# Patient Record
Sex: Male | Born: 1990 | Race: White | Hispanic: No | Marital: Single | Smoking: Never smoker
Health system: Southern US, Community
[De-identification: ages and names within clinical notes are randomized; demographics above are authoritative.]

---
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
## 2016-05-06 ENCOUNTER — Encounter: Payer: Self-pay | Admitting: Emergency Medicine

## 2016-05-06 ENCOUNTER — Emergency Department
Admission: EM | Admit: 2016-05-06 | Discharge: 2016-05-06 | Disposition: A | Discharge status: Home / Self Care | Payer: Self-pay | Attending: Emergency Medicine | Admitting: Emergency Medicine

## 2016-05-06 DIAGNOSIS — K029 Dental caries, unspecified: Secondary | ICD-10-CM | POA: Insufficient documentation

## 2016-05-06 DIAGNOSIS — K0889 Other specified disorders of teeth and supporting structures: Secondary | ICD-10-CM | POA: Insufficient documentation

## 2016-05-06 MED ORDER — AMOXICILLIN 500 MG PO TABS: 500.0000 mg | ORAL_TABLET | Freq: Three times a day (TID) | ORAL | 0 refills | Status: AC

## 2016-05-06 MED ORDER — NAPROXEN 500 MG PO TABS: 500.0000 mg | ORAL_TABLET | Freq: Two times a day (BID) | ORAL | 0 refills | Status: AC

## 2016-05-06 MED ORDER — TRAMADOL HCL 50 MG PO TABS: 50.0000 mg | ORAL_TABLET | Freq: Four times a day (QID) | ORAL | 0 refills | Status: DC | PRN

## 2016-05-06 NOTE — ED Triage Notes (Signed)
Pt states has been having pain from his wisdom teeth, states he has not been able to see a dentist due to lack of insurance. Pt states pain started in R lower jaw, and has progressed to be on R upper jaw. No obvious swelling noted at this time.

## 2016-05-06 NOTE — ED Notes (Signed)
NAD noted at time of D/C. Pt denies questions or concerns. Pt ambulatory to the lobby at this time.  

## 2016-05-06 NOTE — Discharge Instructions (Signed)
OPTIONS FOR DENTAL FOLLOW UP CARE ° °[place] Department of Health and Human Services - Local Safety Net Dental Clinics °http://www.ncdhhs.gov/dph/oralhealth/services/safetynetclinics.htm °  °[facility] ([phone]) ° °[facility] ([phone]) ° °[facility] ([phone] ext [phone]) ° °[facility] ([phone]) ° °[facility] ([phone]) °This clinic caters to the indigent population and is on a lottery system. °Location: °[facility], [address], [place] °Clinic Hours: °Wednesdays from 6pm - 9pm, patients seen by a lottery system. °For dates, call or go to [email or web address] °Services: °Cleanings, fillings and simple extractions. °Payment Options: °DENTAL WORK IS FREE OF CHARGE. Bring proof of income or support. °Best way to get seen: °Arrive at 5:15 pm - this is a lottery, NOT first come/first serve, so arriving earlier will not increase your chances of being seen. °  °  °[facility] °[phone] °Select option 1 for emergencies °  °Location: °[facility], [address], [place] °Clinic Hours: °No walk-ins accepted - call the day before to schedule an appointment. °Check in times are 9:30 am and 1:30 pm. °Services: °Simple extractions, temporary fillings, pulpectomy/pulp debridement, uncomplicated abscess drainage. °Payment Options: °PAYMENT IS DUE AT THE TIME OF SERVICE.  Fee is usually $100-200, additional surgical procedures (e.g. abscess drainage) may be extra. °Cash, checks, Visa/MasterCard accepted.  Can file Medicaid if patient is covered for dental - patient should call case worker to check. °No discount for [facility] patients. °Best way to get seen: °MUST call the day before and get onto the schedule. Can usually be seen the next 1-2 days. No walk-ins accepted. °  °  °[facility] °[phone] °   °Location: °[facility], [address], [place] °Clinic Hours: °M, W, Th, F 8am or 1:30pm, Tues 9a or 1:30 - first come/first served. °Services: °Simple extractions, temporary fillings, uncomplicated abscess drainage.  You do not need to be an [place] resident. °Payment Options: °PAYMENT IS DUE AT THE TIME OF SERVICE. °Dental insurance, otherwise sliding scale - bring proof of income or support. °Depending on income and treatment needed, cost is usually $50-200. °Best way to get seen: °Arrive early as it is first come/first served. °  °  °[facility] °[phone] °  °Location: °[address] °Clinic Hours: °Mon-Thu 8a-5p °Services: °Most basic dental services including extractions and fillings. °Payment Options: °PAYMENT IS DUE AT THE TIME OF SERVICE. °Sliding scale, up to 50% off - bring proof if income or support. °Medicaid with dental option accepted. °Best way to get seen: °Call to schedule an appointment, can usually be seen within 2 weeks OR they will try to see walk-ins - show up at 8a or 2p (you may have to wait). °  °  °[facility] °[phone] °[place] RESIDENTS ONLY °  °Location: °[facility], [address], [place],  [address] °Clinic Hours: By appointment only. °Monday - Thursday 8am-5pm, Friday 8am-12pm °Services: Cleanings, fillings, extractions. °Payment Options: °PAYMENT IS DUE AT THE TIME OF SERVICE. °Cash, Visa or MasterCard. Sliding scale - $30 minimum per service. °Best way to get seen: °Come in to office, complete packet and make an appointment - need proof of income °or support monies for each household member and proof of [place] residence. °Usually takes about a month to get in. °  °  °[facility] °[phone] °  °Location: °[address]   [place] °Clinic Hours: Walk-in Urgent Care Dental Services are offered Monday-Friday  mornings only. °The numbers of emergencies accepted daily is limited to the number of °providers available. °Maximum 15 - Mondays, Wednesdays & Thursdays °Maximum 10 - Tuesdays & Fridays °Services: °You do not need to be a [place] resident to be seen for a dental emergency. °Emergencies are defined as pain, swelling, abnormal bleeding, or dental trauma. Walkins will receive x-rays if needed. °NOTE: Dental cleaning is not an emergency. °Payment Options: °PAYMENT IS DUE AT THE TIME OF SERVICE. °Minimum co-pay is $40.00 for uninsured patients. °Minimum co-pay is $3.00 for Medicaid with dental coverage. °Dental Insurance is accepted and must be presented at time of visit. °Medicare does not cover dental. °Forms of payment: Cash, credit card, checks. °Best way to get seen: °If not previously registered with the clinic, walk-in dental registration begins at 7:15 am and is on a first come/first serve basis. °If previously registered with the clinic, call to make an appointment. °  °  °[facility] °[phone] °[place] RESIDENTS ONLY °  °Location: °[address], [place], [place] °Clinic Hours: °Mon-Thu 10a-2p °Services: Extractions only! °Payment Options: °FREE (donations accepted) - bring proof of income or support °Best way to get seen: °Call and schedule an appointment OR come at 8am on the 1st Monday of every month (except for holidays) when it is first come/first served. °  °  °[facility] °[phone] °  °Location: °[address], [place] °Clinic Hours: °Friday mornings °Services, Payment Options, Best way to get seen: °Call for info ° °Please call and schedule a dental appointment as soon as possible. You will need to be seen within the next 14 days. Return to the emergency department for symptoms that change or worsen if you're unable to schedule an appointment. ° °

## 2016-05-06 NOTE — ED Provider Notes (Signed)
[facility] Emergency Department Provider Note ____________________________________________  Time seen: Approximately 2:52 PM  I have reviewed the triage vital signs and the nursing notes.   HISTORY  Chief Complaint Dental Pain   HPI Matthew Hampton is a 25 y.o. male who presents to the emergency department for evaluation of dental pain. He states that he's been having pain from his system teeth but he has been unable to see his dentist due to lack of insurance. Pain is in the right lower jaw with radiation into the right upper jaw. He states he has been taking ibuprofen, using ice packs to his face, and chewing on ice to help with the pain without any relief. He denies fever.  History reviewed. No pertinent past medical history.  There are no active problems to display for this patient.   History reviewed. No pertinent surgical history.  Prior to Admission medications   Medication Sig Start Date End Date Taking? Authorizing Provider  amoxicillin (AMOXIL) 500 MG tablet Take 1 tablet (500 mg total) by mouth 3 (three) times daily. 05/06/16   Chinita Pesterari B Naeem Quillin, FNP  naproxen (NAPROSYN) 500 MG tablet Take 1 tablet (500 mg total) by mouth 2 (two) times daily with a meal. 05/06/16   Dede Dobesh B Hailie Searight, FNP  traMADol (ULTRAM) 50 MG tablet Take 1 tablet (50 mg total) by mouth every 6 (six) hours as needed. 05/06/16   Chinita Pesterari B Leroy Pettway, FNP    Allergies Review of patient's allergies indicates no known allergies.  History reviewed. No pertinent family history.  Social History Social History  Substance Use Topics  . Smoking status: Never Smoker  . Smokeless tobacco: Never Used  . Alcohol use No    Review of Systems Constitutional: Well appearing ENT: No otalgia, rhinorrhea, or dysphasia. Musculoskeletal: Positive for jaw pain/trismus. Skin: Negative for erythema ____________________________________________   PHYSICAL EXAM:  VITAL SIGNS: ED Triage Vitals  Enc  Vitals Group     BP 05/06/16 1404 125/86     Pulse Rate 05/06/16 1404 93     Resp 05/06/16 1404 18     Temp 05/06/16 1404 98.3 F (36.8 C)     Temp Source 05/06/16 1404 Oral     SpO2 05/06/16 1404 100 %     Weight 05/06/16 1405 175 lb (79.4 kg)     Height 05/06/16 1405 6' (1.829 m)     Head Circumference --      Peak Flow --      Pain Score 05/06/16 1405 4     Pain Loc --      Pain Edu? --      Excl. in GC? --     Constitutional: Alert and oriented. Well appearing and in no acute distress. Eyes: Conjunctivae are normal.EOMI. Mouth/Throat: Mucous membranes are moist. Oropharynx non-erythematous. Periodontal Exam    Hematological/Lymphatic/Immunilogical: No cervical lymphadenopathy. Respiratory: Normal respiratory effort.  Musculoskeletal: Full ROM x 4 extremities. Neurologic:  Normal speech and language. No gross focal neurologic deficits are appreciated. Speech is normal. No gait instability. Skin:  No swelling or erythema of the right lower jaw noted Psychiatric: Mood and affect are normal. Speech and behavior are normal.  ____________________________________________   LABS (all labs ordered are listed, but only abnormal results are displayed)  Labs Reviewed - No data to display ____________________________________________   RADIOLOGY  Not indicated ____________________________________________   PROCEDURES  Procedure(s) performed: None  Critical Care performed: No  ____________________________________________   INITIAL IMPRESSION / ASSESSMENT AND PLAN / ED  COURSE  Pertinent labs & imaging results that were available during my care of the patient were reviewed by me and considered in my medical decision making (see chart for details). Patient will be prescribed amoxicillin, tramadol, Naprosyn. Patient was advised to see the dentist within 14 days. Also advised to take the antibiotic until finished. Instructed to return to the ER for symptoms that change or  worsen if unable to schedule an appointment. ____________________________________________   FINAL CLINICAL IMPRESSION(S) / ED DIAGNOSES  Final diagnoses:  Pain, dental    Note:  This document was prepared using Dragon voice recognition software and may include unintentional dictation errors.     Chinita PesterCari B Tyric Rodeheaver, FNP 05/06/16 1505    Phineas SemenGraydon Goodman, MD 05/06/16 [phone]

## 2019-05-03 ENCOUNTER — Emergency Department
Admission: EM | Admit: 2019-05-03 | Discharge: 2019-05-03 | Disposition: A | Discharge status: Home / Self Care | Payer: Self-pay | Attending: Emergency Medicine | Admitting: Emergency Medicine

## 2019-05-03 ENCOUNTER — Emergency Department: Payer: Self-pay

## 2019-05-03 ENCOUNTER — Other Ambulatory Visit: Payer: Self-pay

## 2019-05-03 ENCOUNTER — Encounter: Payer: Self-pay | Admitting: Intensive Care

## 2019-05-03 DIAGNOSIS — Z79899 Other long term (current) drug therapy: Secondary | ICD-10-CM | POA: Insufficient documentation

## 2019-05-03 DIAGNOSIS — K0381 Cracked tooth: Secondary | ICD-10-CM | POA: Insufficient documentation

## 2019-05-03 DIAGNOSIS — K047 Periapical abscess without sinus: Secondary | ICD-10-CM | POA: Insufficient documentation

## 2019-05-03 LAB — BASIC METABOLIC PANEL
Anion gap: 7 (ref 5–15)
BUN: 10 mg/dL (ref 6–20)
CO2: 23 mmol/L (ref 22–32)
Calcium: 9 mg/dL (ref 8.9–10.3)
Chloride: 106 mmol/L (ref 98–111)
Creatinine, Ser: 0.75 mg/dL (ref 0.61–1.24)
GFR calc Af Amer: >60 mL/min (ref >60)
GFR calc non Af Amer: >60 mL/min (ref >60)
Glucose, Bld: 94 mg/dL (ref 70–99)
Potassium: 4 mmol/L (ref 3.5–5.1)
Sodium: 136 mmol/L (ref 135–145)

## 2019-05-03 LAB — CBC WITH DIFFERENTIAL/PLATELET
Abs Immature Granulocytes: 0.06 10*3/uL (ref 0.00–0.07)
Basophils Absolute: 0.1 10*3/uL (ref 0.0–0.1)
Basophils Relative: 0 %
Eosinophils Absolute: 0.3 10*3/uL (ref 0.0–0.5)
Eosinophils Relative: 2 %
HCT: 38.6 % — ABNORMAL LOW (ref 39.0–52.0)
Hemoglobin: 13.2 g/dL (ref 13.0–17.0)
Immature Granulocytes: 0 %
Lymphocytes Relative: 16 %
Lymphs Abs: 2.2 10*3/uL (ref 0.7–4.0)
MCH: 31.4 pg (ref 26.0–34.0)
MCHC: 34.2 g/dL (ref 30.0–36.0)
MCV: 91.9 fL (ref 80.0–100.0)
Monocytes Absolute: 1.5 10*3/uL — ABNORMAL HIGH (ref 0.1–1.0)
Monocytes Relative: 11 %
Neutro Abs: 9.8 10*3/uL — ABNORMAL HIGH (ref 1.7–7.7)
Neutrophils Relative %: 71 %
Platelets: 312 10*3/uL (ref 150–400)
RBC: 4.2 MIL/uL — ABNORMAL LOW (ref 4.22–5.81)
RDW: 12 % (ref 11.5–15.5)
WBC: 14 10*3/uL — ABNORMAL HIGH (ref 4.0–10.5)
nRBC: 0 % (ref 0.0–0.2)

## 2019-05-03 MED ORDER — TRAMADOL HCL 50 MG PO TABS
50.0000 mg | ORAL_TABLET | Freq: Once | ORAL | Status: AC
  Administered 2019-05-03: 50 mg via ORAL
  Filled 2019-05-03: qty 1

## 2019-05-03 MED ORDER — IOHEXOL 300 MG/ML  SOLN
75.0000 mL | Freq: Once | INTRAMUSCULAR | Status: AC | PRN
  Administered 2019-05-03: 75 mL via INTRAVENOUS

## 2019-05-03 MED ORDER — TRAMADOL HCL 50 MG PO TABS: 50.0000 mg | ORAL_TABLET | Freq: Four times a day (QID) | ORAL | 0 refills | Status: AC | PRN

## 2019-05-03 MED ORDER — CLINDAMYCIN HCL 300 MG PO CAPS: 300.0000 mg | ORAL_CAPSULE | Freq: Three times a day (TID) | ORAL | 0 refills | Status: AC

## 2019-05-03 MED ORDER — CLINDAMYCIN PHOSPHATE 600 MG/50ML IV SOLN
600.0000 mg | Freq: Once | INTRAVENOUS | Status: AC
  Administered 2019-05-03: 600 mg via INTRAVENOUS
  Filled 2019-05-03: qty 50

## 2019-05-03 MED ORDER — KETOROLAC TROMETHAMINE 30 MG/ML IJ SOLN
30.0000 mg | Freq: Once | INTRAMUSCULAR | Status: AC
  Administered 2019-05-03: 30 mg via INTRAVENOUS
  Filled 2019-05-03: qty 1

## 2019-05-03 NOTE — Discharge Instructions (Signed)
OPTIONS FOR DENTAL FOLLOW UP CARE ° °[place] Department of Health and Human Services - Local Safety Net Dental Clinics °http://www.ncdhhs.gov/dph/oralhealth/services/safetynetclinics.htm °  °[facility] ([phone]) ° °[facility] ([phone]) ° °[facility] ([phone] ext [phone]) ° °[facility] ([phone]) ° °[facility] ([phone]) °This clinic caters to the indigent population and is on a lottery system. °Location: °[facility], [address], [place] °Clinic Hours: °Wednesdays from 6pm - 9pm, patients seen by a lottery system. °For dates, call or go to [email or web address] °Services: °Cleanings, fillings and simple extractions. °Payment Options: °DENTAL WORK IS FREE OF CHARGE. Bring proof of income or support. °Best way to get seen: °Arrive at 5:15 pm - this is a lottery, NOT first come/first serve, so arriving earlier will not increase your chances of being seen. °  °  °[facility] °[phone] °Select option 1 for emergencies °  °Location: °[facility], [address], [place] °Clinic Hours: °No walk-ins accepted - call the day before to schedule an appointment. °Check in times are 9:30 am and 1:30 pm. °Services: °Simple extractions, temporary fillings, pulpectomy/pulp debridement, uncomplicated abscess drainage. °Payment Options: °PAYMENT IS DUE AT THE TIME OF SERVICE.  Fee is usually $100-200, additional surgical procedures (e.g. abscess drainage) may be extra. °Cash, checks, Visa/MasterCard accepted.  Can file Medicaid if patient is covered for dental - patient should call case worker to check. °No discount for [facility] patients. °Best way to get seen: °MUST call the day before and get onto the schedule. Can usually be seen the next 1-2 days. No walk-ins accepted. °  °  °[facility] °[phone] °   °Location: °[facility], [address], [place] °Clinic Hours: °M, W, Th, F 8am or 1:30pm, Tues 9a or 1:30 - first come/first served. °Services: °Simple extractions, temporary fillings, uncomplicated abscess drainage.  You do not need to be an [place] resident. °Payment Options: °PAYMENT IS DUE AT THE TIME OF SERVICE. °Dental insurance, otherwise sliding scale - bring proof of income or support. °Depending on income and treatment needed, cost is usually $50-200. °Best way to get seen: °Arrive early as it is first come/first served. °  °  °[facility] °[phone] °  °Location: °[address] °Clinic Hours: °Mon-Thu 8a-5p °Services: °Most basic dental services including extractions and fillings. °Payment Options: °PAYMENT IS DUE AT THE TIME OF SERVICE. °Sliding scale, up to 50% off - bring proof if income or support. °Medicaid with dental option accepted. °Best way to get seen: °Call to schedule an appointment, can usually be seen within 2 weeks OR they will try to see walk-ins - show up at 8a or 2p (you may have to wait). °  °  °[facility] °[phone] °[place] RESIDENTS ONLY °  °Location: °[facility], [address], [place], [place] [address] °Clinic Hours: By appointment only. °Monday - Thursday 8am-5pm, Friday 8am-12pm °Services: Cleanings, fillings, extractions. °Payment Options: °PAYMENT IS DUE AT THE TIME OF SERVICE. °Cash, Visa or MasterCard. Sliding scale - $30 minimum per service. °Best way to get seen: °Come in to office, complete packet and make an appointment - need proof of income °or support monies for each household member and proof of [place] residence. °Usually takes about a month to get in. °  °  °[facility] °[phone] °  °Location: °[address]   [place] °Clinic Hours: Walk-in Urgent Care Dental Services are offered Monday-Friday  mornings only. °The numbers of emergencies accepted daily is limited to the number of °providers available. °Maximum 15 - Mondays, Wednesdays & Thursdays °Maximum 10 - Tuesdays & Fridays °Services: °You do not need to be a [place] resident to be seen for a dental emergency. °Emergencies are defined as pain, swelling, abnormal bleeding, or dental trauma. Walkins will receive x-rays if needed. °NOTE: Dental cleaning is not an emergency. °Payment Options: °PAYMENT IS DUE AT THE TIME OF SERVICE. °Minimum co-pay is $40.00 for uninsured patients. °Minimum co-pay is $3.00 for Medicaid with dental coverage. °Dental Insurance is accepted and must be presented at time of visit. °Medicare does not cover dental. °Forms of payment: Cash, credit card, checks. °Best way to get seen: °If not previously registered with the clinic, walk-in dental registration begins at 7:15 am and is on a first come/first serve basis. °If previously registered with the clinic, call to make an appointment. °  °  °[facility] °[phone] °[place] RESIDENTS ONLY °  °Location: °[address], [place], [place] °Clinic Hours: °Mon-Thu 10a-2p °Services: Extractions only! °Payment Options: °FREE (donations accepted) - bring proof of income or support °Best way to get seen: °Call and schedule an appointment OR come at 8am on the 1st Monday of every month (except for holidays) when it is first come/first served. °  °  °[facility] °[phone] °  °Location: °[address], [place] °Clinic Hours: °Friday mornings °Services, Payment Options, Best way to get seen: °Call for info °

## 2019-05-03 NOTE — ED Triage Notes (Signed)
Patient presents with left sided jaw swelling. Reports dental pain previously and when subsided the swelling started

## 2019-05-03 NOTE — ED Provider Notes (Signed)
[facility] Emergency Department Provider Note  ____________________________________________  Time seen: Approximately 8:57 PM  I have reviewed the triage vital signs and the nursing notes.   HISTORY  Chief Complaint Jaw Pain (left) and Facial Swelling    HPI Matthew Hampton is a 28 y.o. male presents to the emergency department with left lower jaw swelling from a broken inferior 19.  He denies pain underneath the tongue.  Patient denies fever and chills.  Patient states that he has taken amoxicillin for 1 day.  Patient states that he has experienced similar symptoms in the past which have improved with antibiotic.  He has been managing his own secretions at home.  No other alleviating measures have been attempted.        History reviewed. No pertinent past medical history.  There are no active problems to display for this patient.   History reviewed. No pertinent surgical history.  Prior to Admission medications   Medication Sig Start Date End Date Taking? Authorizing Provider  amoxicillin (AMOXIL) 500 MG tablet Take 1 tablet (500 mg total) by mouth 3 (three) times daily. 05/06/16   Triplett, Rulon Eisenmengerari B, FNP  clindamycin (CLEOCIN) 300 MG capsule Take 1 capsule (300 mg total) by mouth 3 (three) times daily for 10 days. 05/03/19 05/13/19  Orvil FeilWoods, Linsay Vogt M, PA-C  naproxen (NAPROSYN) 500 MG tablet Take 1 tablet (500 mg total) by mouth 2 (two) times daily with a meal. 05/06/16   Triplett, Cari B, FNP  traMADol (ULTRAM) 50 MG tablet Take 1 tablet (50 mg total) by mouth every 6 (six) hours as needed for up to 3 days. 05/03/19 05/06/19  Orvil FeilWoods, Maciah Feeback M, PA-C    Allergies Patient has no known allergies.  History reviewed. No pertinent family history.  Social History Social History   Tobacco Use  . Smoking status: Never Smoker  . Smokeless tobacco: Never Used  Substance Use Topics  . Alcohol use: No  . Drug use: No     Review of Systems  Constitutional: No  fever/chills Eyes: No visual changes. No discharge ENT: Patient has left-sided lower jaw pain and swelling. Cardiovascular: no chest pain. Respiratory: no cough. No SOB. Gastrointestinal: No abdominal pain.  No nausea, no vomiting.  No diarrhea.  No constipation. Genitourinary: Negative for dysuria. No hematuria Musculoskeletal: Negative for musculoskeletal pain. Skin: Negative for rash, abrasions, lacerations, ecchymosis. Neurological: Negative for headaches, focal weakness or numbness.   ____________________________________________   PHYSICAL EXAM:  VITAL SIGNS: ED Triage Vitals [05/03/19 1708]  Enc Vitals Group     BP 118/85     Pulse Rate (!) 119     Resp 16     Temp 98.4 F (36.9 C)     Temp Source Oral     SpO2 99 %     Weight 175 lb (79.4 kg)     Height 5\' 11"  (1.803 m)     Head Circumference      Peak Flow      Pain Score 7     Pain Loc      Pain Edu?      Excl. in GC?      Constitutional: Alert and oriented. Well appearing and in no acute distress. Eyes: Conjunctivae are normal. PERRL. EOMI. Head: Atraumatic. ENT:      Ears:       Nose: No congestion/rhinnorhea.      Mouth/Throat: Mucous membranes are moist.  Patient has edema of the left lower jaw and a broken  inferior 19.  No drainable abscess.  No appreciable fluctuance of the gingiva surrounding inferior 19.  No pain underneath the tongue. Cardiovascular: Normal rate, regular rhythm. Normal S1 and S2.  Good peripheral circulation. Respiratory: Normal respiratory effort without tachypnea or retractions. Lungs CTAB. Good air entry to the bases with no decreased or absent breath sounds.  Skin:  Skin is warm, dry and intact. No rash noted. Psychiatric: Mood and affect are normal. Speech and behavior are normal. Patient exhibits appropriate insight and judgement.   ____________________________________________   LABS (all labs ordered are listed, but only abnormal results are displayed)  Labs Reviewed   CBC WITH DIFFERENTIAL/PLATELET - Abnormal; Notable for the following components:      Result Value   WBC 14.0 (*)    RBC 4.20 (*)    HCT 38.6 (*)    Neutro Abs 9.8 (*)    Monocytes Absolute 1.5 (*)    All other components within normal limits  BASIC METABOLIC PANEL   ____________________________________________  EKG   ____________________________________________  RADIOLOGY I personally viewed and evaluated these images as part of my medical decision making, as well as reviewing the written report by the radiologist.**  Ct Soft Tissue Neck W Contrast  Result Date: 05/03/2019 CLINICAL DATA:  Initial evaluation for acute jaw pain. EXAM: CT NECK WITH CONTRAST TECHNIQUE: Multidetector CT imaging of the neck was performed using the standard protocol following the bolus administration of intravenous contrast. CONTRAST:  75mL OMNIPAQUE IOHEXOL 300 MG/ML  SOLN COMPARISON:  None available. FINDINGS: Pharynx and larynx: Oral cavity within normal limits. Prominent dental caries involving the left second and third mandibular molars noted. There is prominent soft tissue swelling with inflammatory stranding about the adjacent body of the left mandible, consistent with acute infection. Ill-defined hypodensity within this region measuring approximately 2.9 x 1.7 x 2.9 cm concerning for early odontogenic abscess (series 2, image 52). Adjacent swelling and inflammatory stranding within the left submandibular space, extending into the submental region and partially down the anterior aspect of the neck. No significant extension into the floor of mouth at this time. Oropharynx and nasopharynx within normal limits. No retropharyngeal collection. Epiglottis normal. Vallecula clear. Remainder of the hypopharynx and supraglottic larynx within normal limits. True cords symmetric and normal. Subglottic airway clear. Salivary glands: Parotid and right submandibular glands within normal limits. Inflammatory changes  surround the left submandibular gland related to the inflammatory process about the adjacent left mandible. Left submandibular gland itself within normal limits. Thyroid: Thyroid normal. Lymph nodes: Mildly prominent left level IB and II lymph nodes, likely reactive. No other pathologically enlarged lymph nodes seen within the neck. Vascular: Normal intravascular enhancement seen throughout the neck. Limited intracranial: Unremarkable. Visualized orbits: Unremarkable. Mastoids and visualized paranasal sinuses: Visualized paranasal sinuses are clear. Visualized mastoids and middle ear cavities are well pneumatized and free of fluid. Skeleton: Mild wedging deformity at the superior endplate of C5 is chronic in appearance. No acute osseous abnormality. No discrete lytic or blastic osseous lesions. Upper chest: Visualized upper chest demonstrates no acute finding. Partially visualized lungs are clear. Other: None. IMPRESSION: 1. Prominent dental caries involving the left second and third mandibular molars with associated soft tissue swelling and inflammatory stranding about the adjacent body of the left mandible, consistent with acute infection/cellulitis. Ill-defined hypodensity within this region measuring approximately 2.9 x 1.7 x 2.9 cm consistent with odontogenic abscess. 2. Mildly prominent left-sided cervical adenopathy, likely reactive. Electronically Signed   By: Janell QuietBenjamin  McClintock M.D.  On: 05/03/2019 18:48    ____________________________________________    PROCEDURES  Procedure(s) performed:    Procedures    Medications  clindamycin (CLEOCIN) IVPB 600 mg (600 mg Intravenous New Bag/Given 05/03/19 2054)  iohexol (OMNIPAQUE) 300 MG/ML solution 75 mL (75 mLs Intravenous Contrast Given 05/03/19 1833)  traMADol (ULTRAM) tablet 50 mg (50 mg Oral Given 05/03/19 2054)  ketorolac (TORADOL) 30 MG/ML injection 30 mg (30 mg Intravenous Given 05/03/19 2052)      ____________________________________________   INITIAL IMPRESSION / ASSESSMENT AND PLAN / ED COURSE  Pertinent labs & imaging results that were available during my care of the patient were reviewed by me and considered in my medical decision making (see chart for details).  Review of the [place] CSRS was performed in accordance of the [place] prior to dispensing any controlled drugs.         Assessment and plan Dental abscess 28 year old male presents to the emergency department with left lower jaw pain and swelling.  Patient has experienced symptoms for the past 3 to 4 days.  Patient was mildly tachycardic at triage but vital signs were otherwise reassuring.  Patient had edema of the left lower jaw with no appreciable drainable fluid collection in the ED.  CT of the neck reveals a odontogenic abscess and findings consistent with cellulitis.  Patient had leukocytosis on CBC.  BMP was reassuring.  Patient was given IV clindamycin in the emergency department and discharged with clindamycin.  He was also discharged with tramadol for pain.  He was advised to make an appointment with a local dentist as soon as possible.  All patient questions were answered.    ____________________________________________  FINAL CLINICAL IMPRESSION(S) / ED DIAGNOSES  Final diagnoses:  Dental abscess      NEW MEDICATIONS STARTED DURING THIS VISIT:  ED Discharge Orders         Ordered    clindamycin (CLEOCIN) 300 MG capsule  3 times daily     05/03/19 2034    traMADol (ULTRAM) 50 MG tablet  Every 6 hours PRN     05/03/19 2034              This chart was dictated using voice recognition software/Dragon. Despite best efforts to proofread, errors can occur which can change the meaning. Any change was purely unintentional.    Karren Cobble 05/03/19 2103    Vanessa , MD 05/04/19 [phone]

## 2020-08-30 IMAGING — CT CT NECK WITH CONTRAST
3 of 4 series · 12 of 35 positions shown, 14 images · IV contrast (omnipaque)
Comparison: None available.

CLINICAL DATA: Initial evaluation for acute jaw pain.

EXAM:
CT NECK WITH CONTRAST
TECHNIQUE: Multidetector CT imaging of the neck was performed using the
standard protocol following the bolus administration of intravenous
contrast.
CONTRAST:  75mL OMNIPAQUE IOHEXOL 300 MG/ML  SOLN

[Series 5: sag neck · sagittal · 0.55mm/px · 5 of 82 slices shown, 6 images]
[im 28/82  bone]
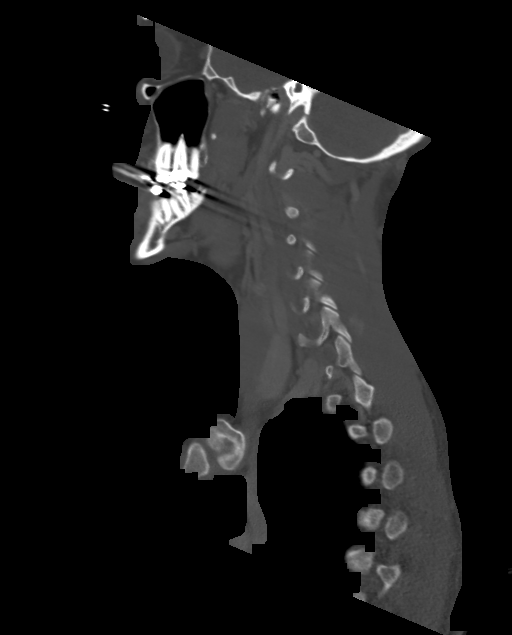
[im 34/82  bone]
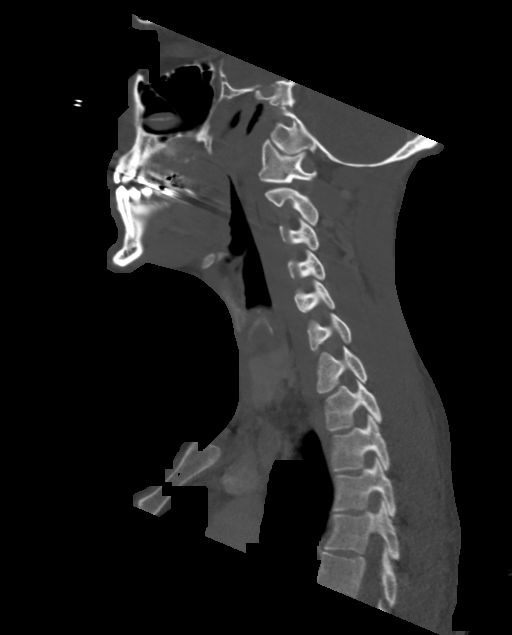
[im 41/82  soft-tissue]
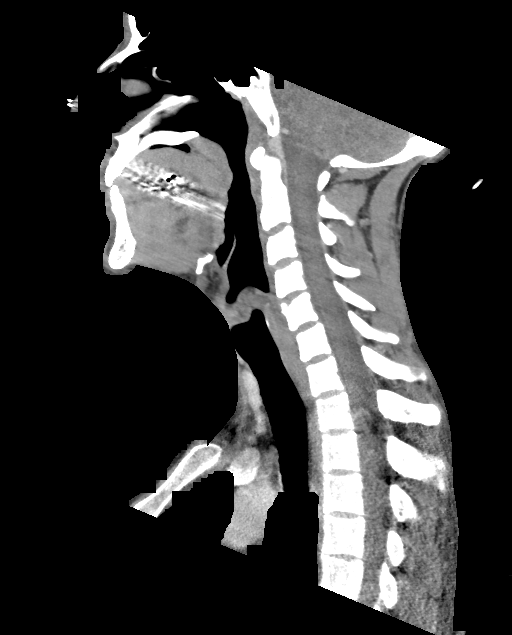
[im 41/82  bone]
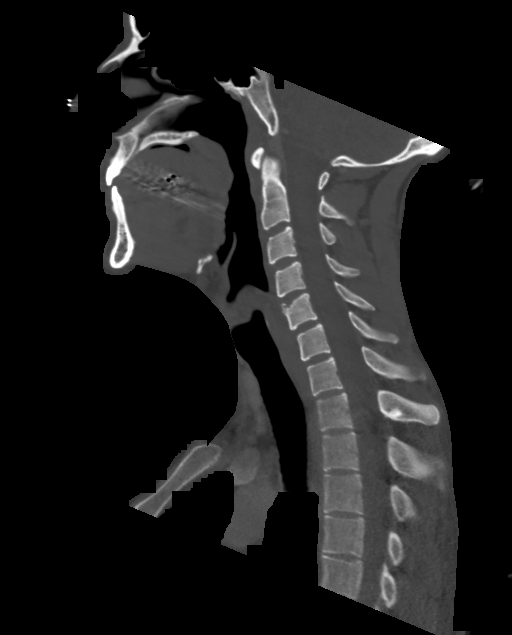
[im 48/82  bone]
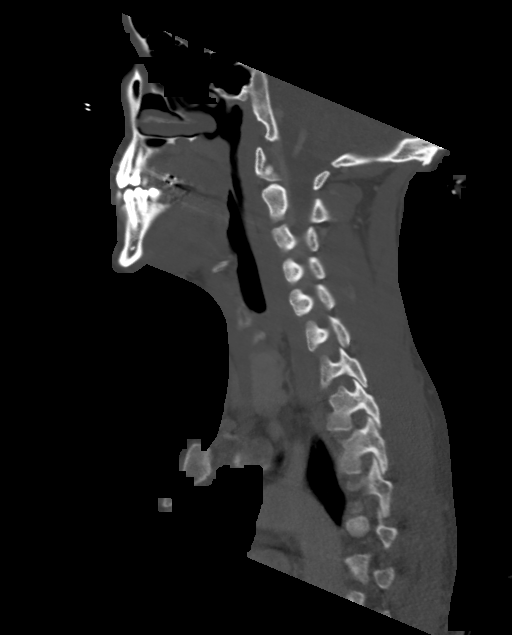
[im 55/82  bone]
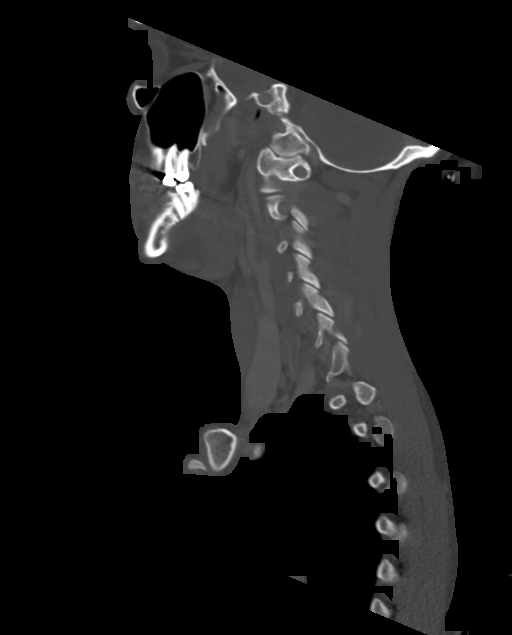

[Series 6: cor neck · coronal · 0.38mm/px · 3 of 137 slices shown]
[im 34/137  bone]
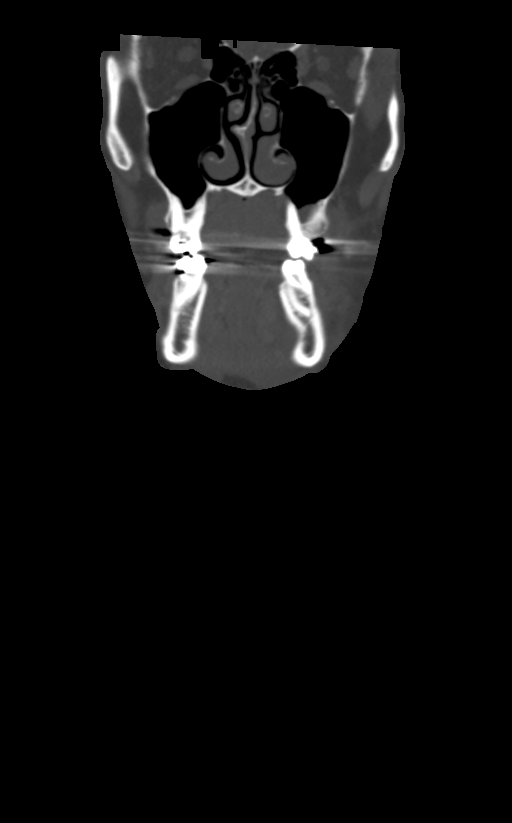
[im 57/137  bone]
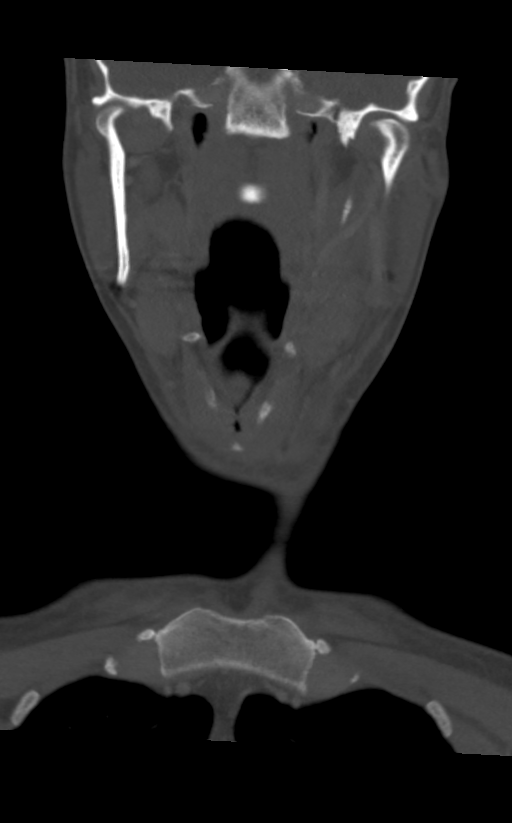
[im 80/137  bone]
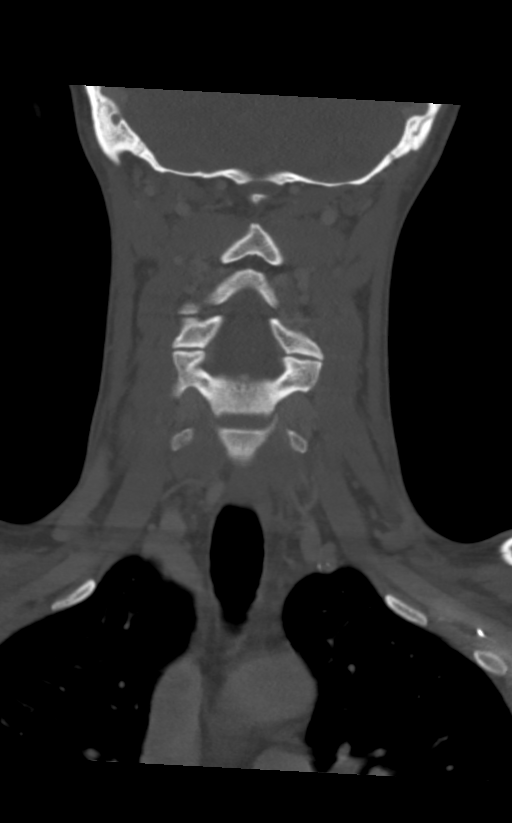

[Series 7: orthogonal ax · axial · 0.41mm/px · z∈[-318,-121]mm · 4 of 159 slices shown, 5 images]
[im 23/159  soft-tissue]
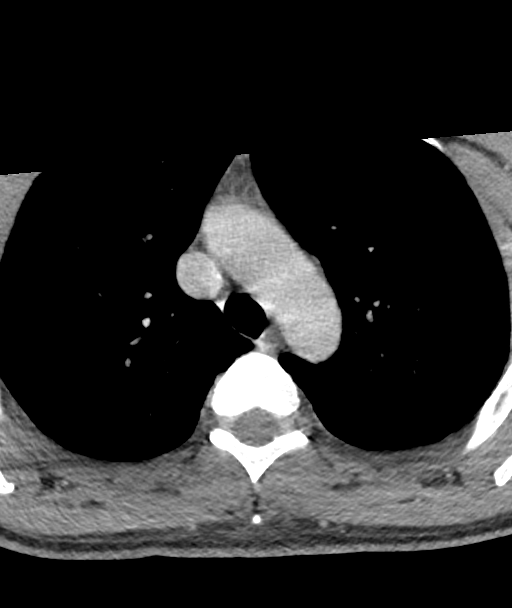
[im 23/159  bone]
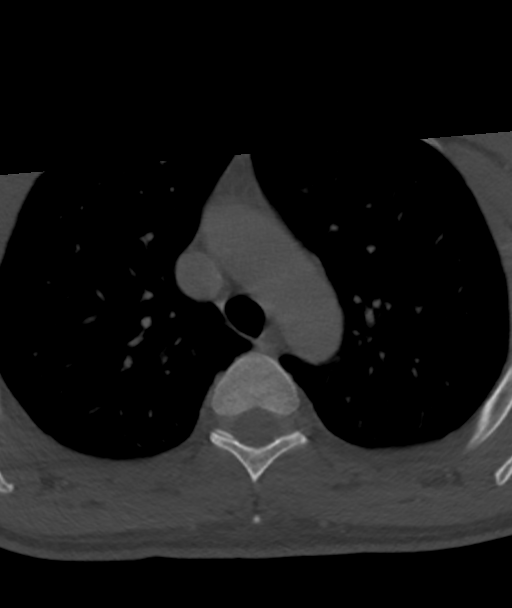
[im 68/159  bone]
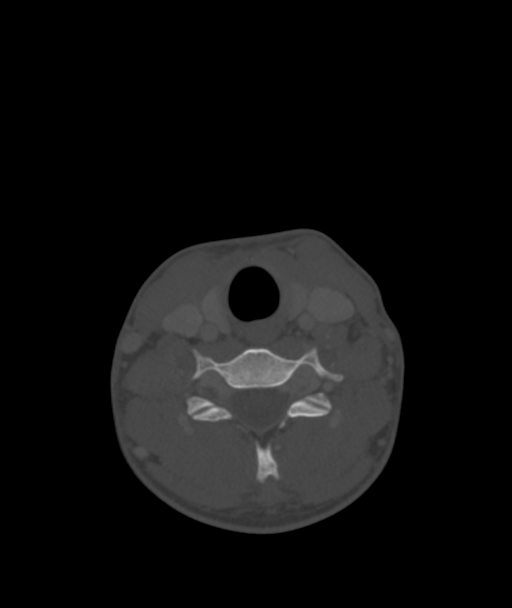
[im 91/159  bone]
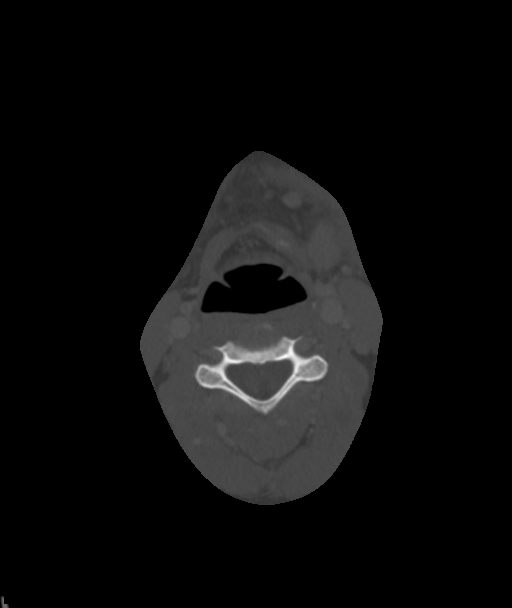
[im 136/159  bone]
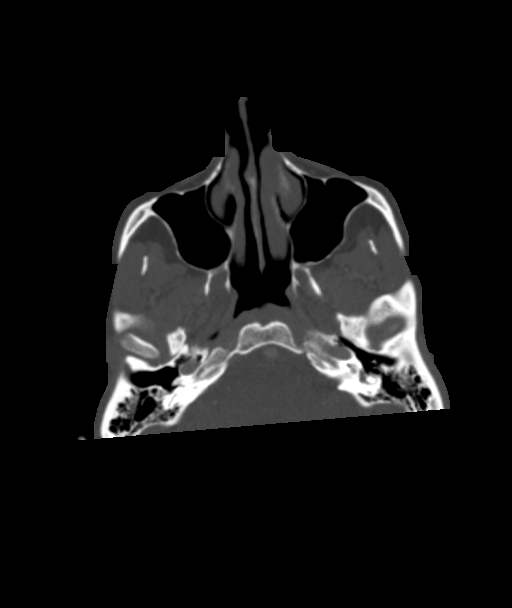

[12 of 35 positions shown; findings below may reference images not displayed]

FINDINGS: Pharynx and larynx: Oral cavity within normal limits. Prominent
dental caries involving the left second and third mandibular molars
noted. There is prominent soft tissue swelling with inflammatory
stranding about the adjacent body of the left mandible, consistent
with acute infection. Ill-defined hypodensity within this region
measuring approximately 2.9 x 1.7 x 2.9 cm concerning for early
odontogenic abscess (series 2, image 52). Adjacent swelling and
inflammatory stranding within the left submandibular space,
extending into the submental region and partially down the anterior
aspect of the neck. No significant extension into the floor of mouth
at this time.

Oropharynx and nasopharynx within normal limits. No retropharyngeal
collection. Epiglottis normal. Vallecula clear. Remainder of the
hypopharynx and supraglottic larynx within normal limits. True cords
symmetric and normal. Subglottic airway clear.

Salivary glands: Parotid and right submandibular glands within
normal limits. Inflammatory changes surround the left submandibular
gland related to the inflammatory process about the adjacent left
mandible. Left submandibular gland itself within normal limits.

Thyroid: Thyroid normal.

Lymph nodes: Mildly prominent left level IB and II lymph nodes,
likely reactive. No other pathologically enlarged lymph nodes seen
within the neck.

Vascular: Normal intravascular enhancement seen throughout the neck.

Limited intracranial: Unremarkable.

Visualized orbits: Unremarkable.

Mastoids and visualized paranasal sinuses: Visualized paranasal
sinuses are clear. Visualized mastoids and middle ear cavities are
well pneumatized and free of fluid.

Skeleton: Mild wedging deformity at the superior endplate of C5 is
chronic in appearance. No acute osseous abnormality. No discrete
lytic or blastic osseous lesions.

Upper chest: Visualized upper chest demonstrates no acute finding.
Partially visualized lungs are clear.

Other: None.
IMPRESSION: 1. Prominent dental caries involving the left second and third
mandibular molars with associated soft tissue swelling and
inflammatory stranding about the adjacent body of the left mandible,
consistent with acute infection/cellulitis. Ill-defined hypodensity
within this region measuring approximately 2.9 x 1.7 x 2.9 cm
consistent with odontogenic abscess.
2. Mildly prominent left-sided cervical adenopathy, likely reactive.
